# Patient Record
Sex: Female | Born: 1995 | Race: White | Hispanic: No | Marital: Single | Smoking: Never smoker
Health system: Southern US, Community
[De-identification: ages and names within clinical notes are randomized; demographics above are authoritative.]

## PROBLEM LIST (undated history)

## (undated) DIAGNOSIS — L709 Acne, unspecified: Secondary | ICD-10-CM

## (undated) HISTORY — DX: Acne, unspecified: L70.9

## (undated) HISTORY — PX: NO PAST SURGERIES: SHX2092

---
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
## 2016-06-25 ENCOUNTER — Encounter: Payer: Self-pay | Admitting: Internal Medicine

## 2016-06-26 ENCOUNTER — Encounter: Payer: Self-pay | Admitting: Physician Assistant

## 2016-07-07 ENCOUNTER — Encounter: Payer: Self-pay | Admitting: Physician Assistant

## 2016-07-07 ENCOUNTER — Ambulatory Visit (INDEPENDENT_AMBULATORY_CARE_PROVIDER_SITE_OTHER): Payer: BLUE CROSS/BLUE SHIELD | Admitting: Physician Assistant

## 2016-07-07 ENCOUNTER — Encounter (INDEPENDENT_AMBULATORY_CARE_PROVIDER_SITE_OTHER): Payer: Self-pay

## 2016-07-07 VITALS — BP 96/60 | HR 80 | Ht 65.0 in | Wt 139.0 lb

## 2016-07-07 DIAGNOSIS — K625 Hemorrhage of anus and rectum: Secondary | ICD-10-CM | POA: Diagnosis not present

## 2016-07-07 DIAGNOSIS — K648 Other hemorrhoids: Secondary | ICD-10-CM

## 2016-07-07 DIAGNOSIS — K5900 Constipation, unspecified: Secondary | ICD-10-CM

## 2016-07-07 MED ORDER — HYDROCORTISONE ACETATE 25 MG RE SUPP: 25.0000 mg | Freq: Two times a day (BID) | RECTAL | 1 refills | Status: AC

## 2016-07-07 NOTE — Progress Notes (Signed)
Chief Complaint: Rectal bleeding, internal hemorrhoids, constipation  HPI:  Betty Whitehead is a 21 year old Caucasian female who was referred to me by Berline Lopes, MD for a complaint of rectal bleeding .     Today, the patient describes that since spring of last year she has been  seeing some bright red blood, initially just with wiping, this has been increasing in amount over the past year, noting that she has recently been seeing quite a large amount of blood when she has a bowel movement which is typically every 3-5 days. Most recently over the past week the patient has been using her mother's hydrocortisone cream inserted rectally about once a day for a week and has also been having more frequent bowel movements every day and tells me that her last stool on Friday had no blood in it. This was the first time in almost a year that she had not seen blood. Per patient she was previously prescribed Hydrocortisone cream as well but never used this from her PCP. She had tried her mother's in the past but not consistently. Patient does describe some amount of rectal discomfort after a bowel movement which lingers, this feels more like a "pressure". It is not there all the time but may last 10-15 minutes after bowel movement. Associated symptoms include a "nervous stomach". Patient tells me that she always has a small amount of lingering abdominal pain worse sometimes, she is unsure what this is related to but it does not change with bowel movement.   Patient denies fever, chills, melena, weight loss, fatigue, anorexia, shortness of breath, palpitations, nausea, vomiting, reflux or symptoms that awaken her at night.  Past Medical History:  Diagnosis Date  . Acne     Past Surgical History:  Procedure Laterality Date  . NO PAST SURGERIES      Current Outpatient Prescriptions  Medication Sig Dispense Refill  . Albuterol Sulfate (VENTOLIN HFA IN) Inhale into the lungs.    . fluticasone (FLONASE) 50  MCG/ACT nasal spray Place 1 spray into both nostrils daily. Takes as needed    . LO LOESTRIN FE 1 MG-10 MCG / 10 MCG tablet   11  . hydrocortisone (ANUSOL-HC) 25 MG suppository Place 1 suppository (25 mg total) rectally 2 (two) times daily. 14 suppository 1  . spironolactone (ALDACTONE) 25 MG tablet   10   No current facility-administered medications for this visit.     Allergies as of 07/07/2016 - Review Complete 07/07/2016  Allergen Reaction Noted  . Other  02/20/2014    Family History  Problem Relation Age of Onset  . Colon polyps Maternal Grandfather   . Colon cancer Other     maternal great grandmother  . Liver cancer Other     maternal great grandfather    Social History   Social History  . Marital status: Single    Spouse name: N/A  . Number of children: N/A  . Years of education: N/A   Occupational History  . student    Social History Main Topics  . Smoking status: Never Smoker  . Smokeless tobacco: Never Used  . Alcohol use No  . Drug use: No  . Sexual activity: No   Other Topics Concern  . Not on file   Social History Narrative  . No narrative on file    Review of Systems:    Constitutional: No weight loss, fever, chills, weakness or fatigue Skin: No rash  Cardiovascular: No chest pain  Respiratory: No  SOB  Gastrointestinal: See HPI and otherwise negative Genitourinary: No dysuria  Neurological: No headache, dizziness or syncope Musculoskeletal: No new muscle or joint pain Hematologic: No bruising Psychiatric: No history of depression or anxiety   Physical Exam:  Vital signs: BP 96/60   Pulse 80   Ht  (1.651 m)   Wt 139 lb (63 kg)   BMI 23.13 kg/m   Constitutional:   Pleasant Caucasian female appears to be in NAD, Well developed, Well nourished, alert and cooperative Head:  Normocephalic and atraumatic. Eyes:   PEERL, EOMI. No icterus. Conjunctiva pink. Ears:  Normal auditory acuity. Neck:  Supple Throat: Oral cavity and pharynx  without inflammation, swelling or lesion.  Respiratory: Respirations even and unlabored. Lungs clear to auscultation bilaterally.   No wheezes, crackles, or rhonchi.  Cardiovascular: Normal S1, S2. No MRG. Regular rate and rhythm. No peripheral edema, cyanosis or pallor.  Gastrointestinal:  Soft, nondistended, nontender. No rebound or guarding. Normal bowel sounds. No appreciable masses or hepatomegaly. Rectal:  External exam: No tenderness, no fissure, no hemorrhoids; Anoscopy: Grade 2 internal hemorrhoids inflamed and all 3 columns, evidence of recent bleeding Msk:  Symmetrical without gross deformities. Without edema, no deformity or joint abnormality.  Neurologic:  Alert and  oriented x4;  grossly normal neurologically.  Skin:   Dry and intact without significant lesions or rashes. Psychiatric: Demonstrates good judgement and reason without abnormal affect or behaviors.  No recent labs or imaging.  Assessment: 1. Internal hemorrhoids: With bleeding over the past year, these were identified at time of exam today, if patient continues with bleeding after treatment for hemorrhoids recommend she have colonoscopy for further eval 2. Rectal bleeding: See above 3. Constipation: Per patient a bowel movement every 3-5 days is normal for her, over the past week these have been daily and she recently saw a bowel movement with no blood associated with it; this is likely contributing to internal hemorrhoids  Plan: 1. Recommend patient maintain daily soft formed bowel movements. She should do this by increasing fiber to at least 25-35 g per day with the use of fiber supplement or through her diet. Also discussed she should be drinking at least 6-8 88 ounce glasses of water per day. She should also exercise regularly. 2. Prescribed hydrocortisone suppositories twice a day 7 days with one refill. We did discuss that if the patient continues with bleeding or discomfort after using suppositories should call  our office. At that time would recommend a colonoscopy for further evaluation. 3. Discussed a probiotic with the patient such as Align once daily for the next 2 months. 4. Patient to follow in clinic as needed in the future with myself or Dr. Russella Dar as he is the supervising physician today  Hyacinth Meeker, PA-C [facility] 07/07/2016, 11:17 AM  Cc: Berline Lopes, MD

## 2016-07-07 NOTE — Progress Notes (Signed)
Reviewed and agree with initial management plan.  Charlaine Utsey T. Javelle Donigan, MD FACG 

## 2016-07-07 NOTE — Patient Instructions (Signed)
We have sent the following medications to your pharmacy for you to pick up at your convenience: Hydrocortisone suppository twice a day for 7 days  We have given you a high fiber diet handout. Please strive to have 25-30 grams of fiber daily.   Please purchase the following medications over the counter and take as directed: Probiotic Align once daily

## 2016-07-29 ENCOUNTER — Ambulatory Visit: Payer: BLUE CROSS/BLUE SHIELD | Admitting: Internal Medicine

## 2017-09-18 IMAGING — DX DG CHEST 2V
2 series · 2 of 2 positions shown · non-contrast
Comparison: None.

CLINICAL DATA: Pt has been having lower chest pains just to the
right of the sternum for a couple of days causing some coughing and
SOB at times. Pt has asthma, but has not had any issues with it "in
forever" and doesn't remember the last time she needing to use an
inhaler.

EXAM:
CHEST  2 VIEW

[chest pa]
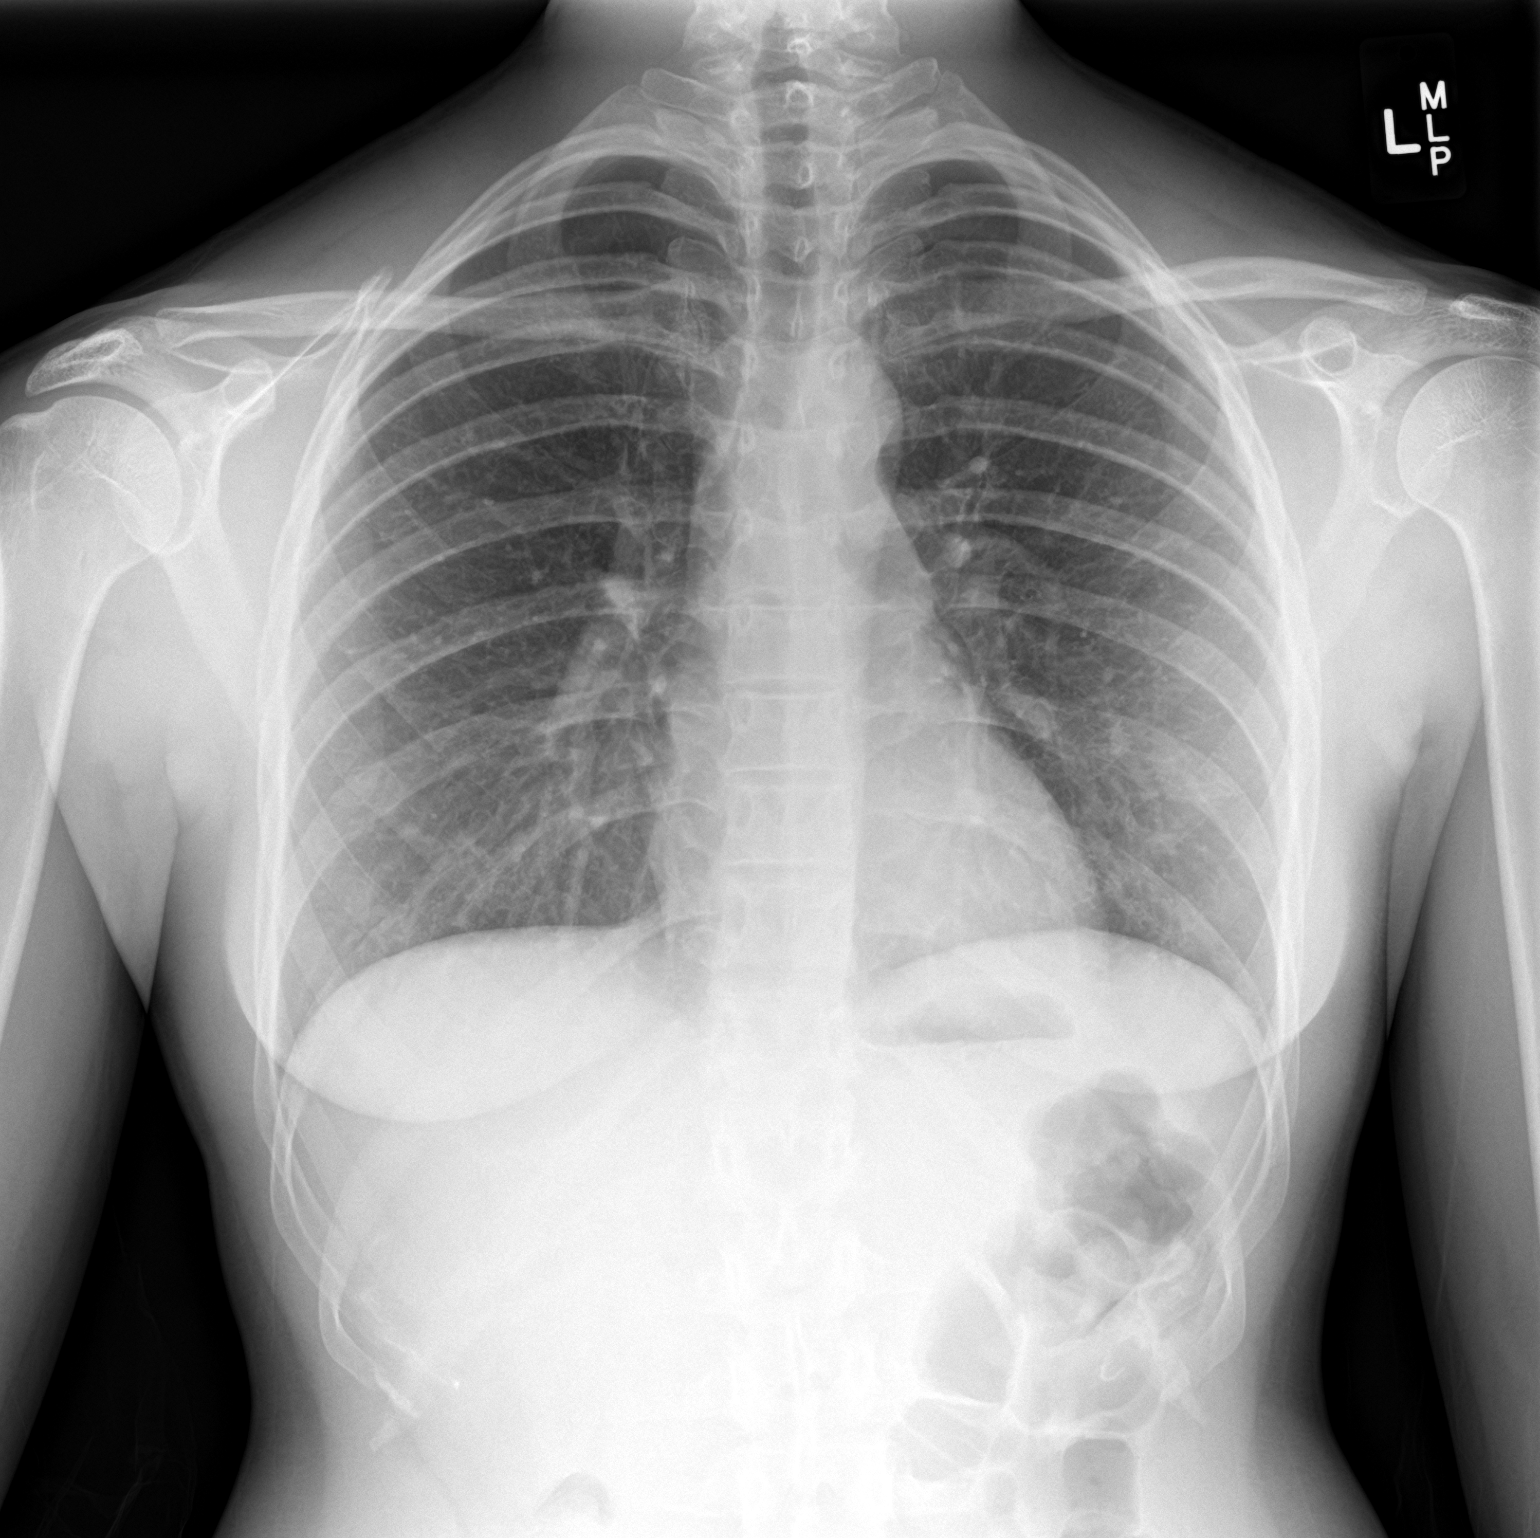

[chest lat]
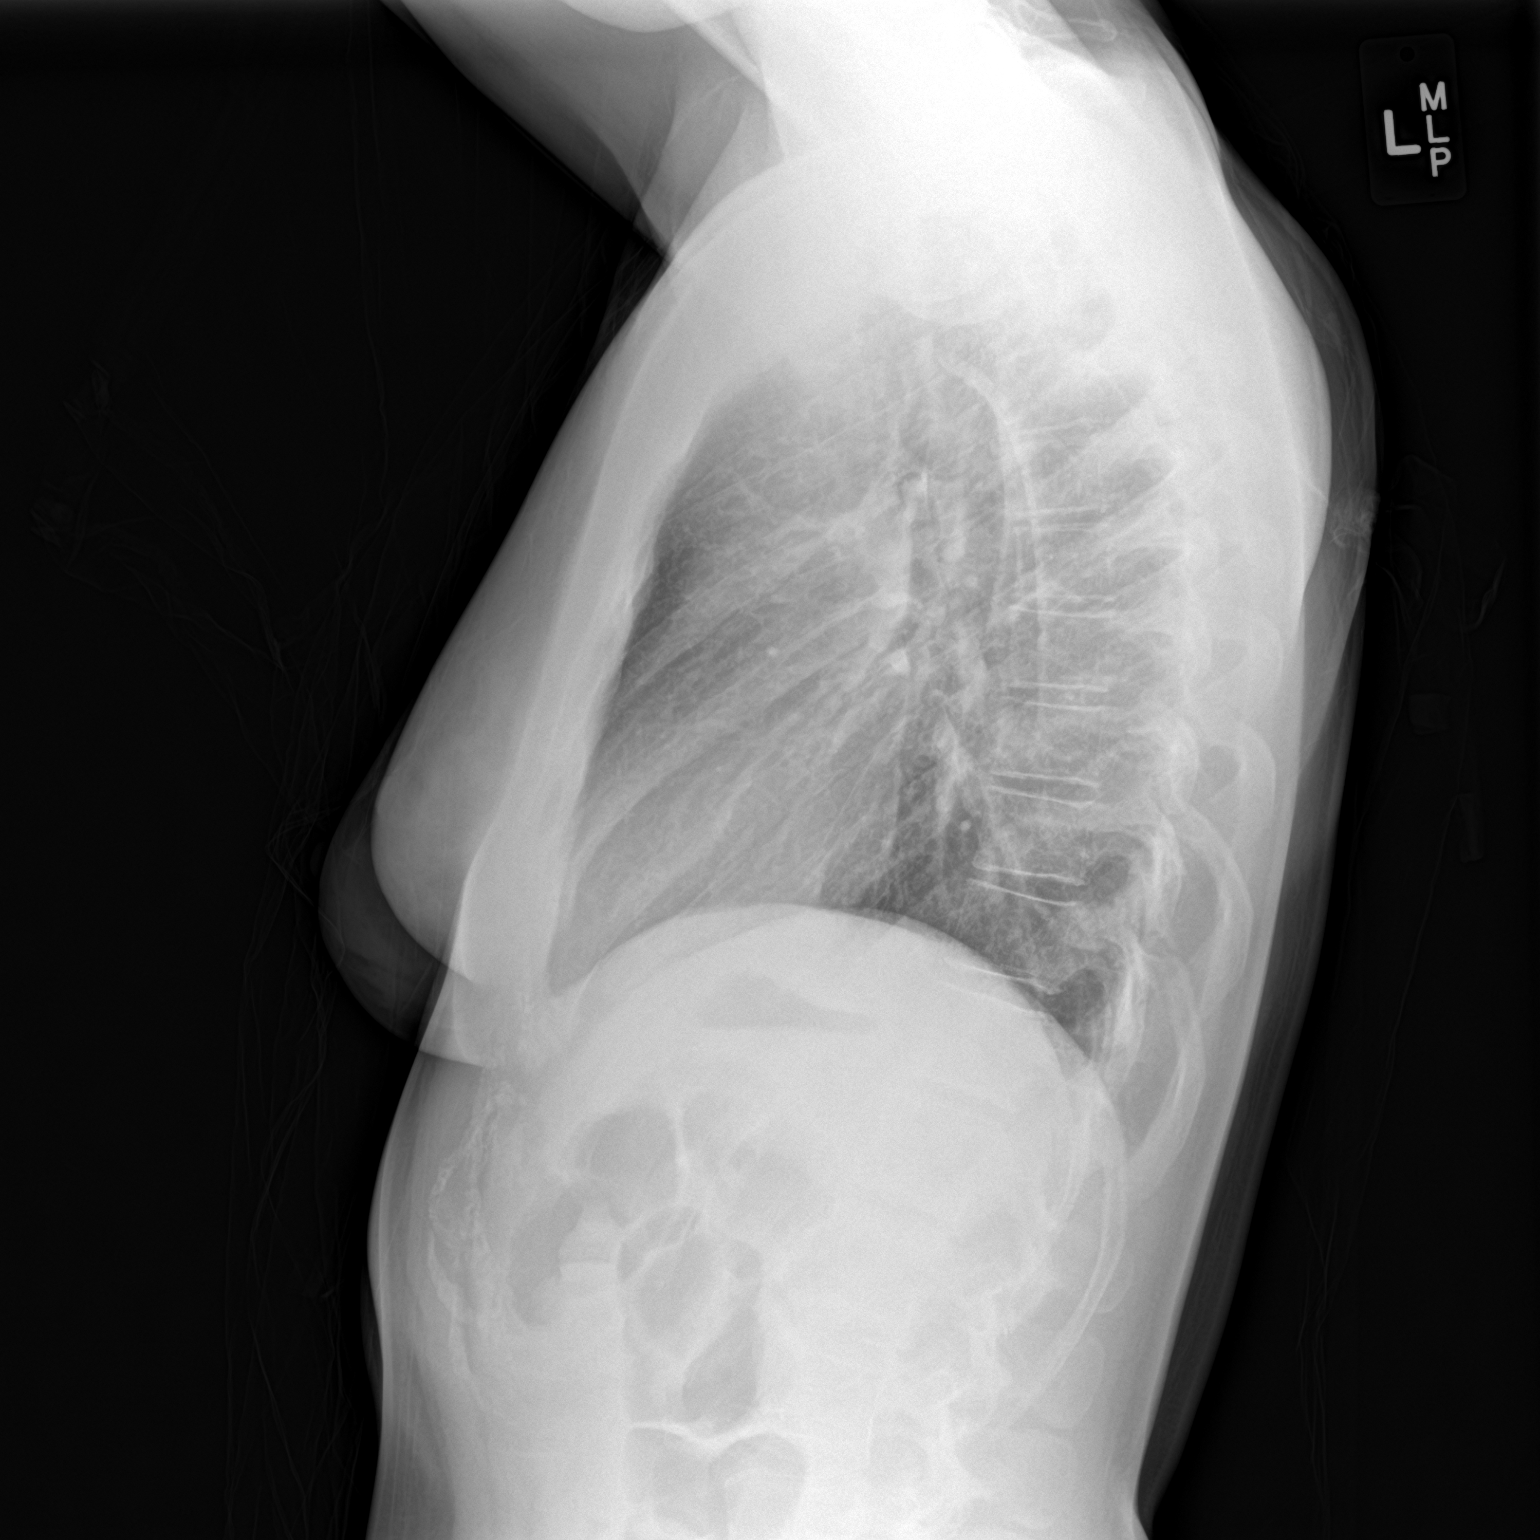

[2 of 2 positions shown; findings below may reference images not displayed]

FINDINGS: The heart size and mediastinal contours are within normal limits.
Both lungs are clear. No pleural effusion or pneumothorax. The
visualized skeletal structures are unremarkable.
IMPRESSION: Normal chest radiographs.
